# Patient Record
Sex: Female | Born: 1975 | Race: Black or African American | Hispanic: No | Marital: Single | Smoking: Former smoker
Health system: Southern US, Community
[De-identification: ages and names within clinical notes are randomized; demographics above are authoritative.]

## PROBLEM LIST (undated history)

## (undated) DIAGNOSIS — E0500 Thyrotoxicosis with diffuse goiter without thyrotoxic crisis or storm: Secondary | ICD-10-CM

## (undated) HISTORY — DX: Thyrotoxicosis with diffuse goiter without thyrotoxic crisis or storm: E05.00

## (undated) HISTORY — PX: BACK SURGERY: SHX140

---

## 2003-11-04 ENCOUNTER — Emergency Department: Payer: Self-pay | Admitting: Emergency Medicine

## 2005-10-03 ENCOUNTER — Observation Stay: Payer: Self-pay | Admitting: Obstetrics and Gynecology

## 2005-10-09 ENCOUNTER — Inpatient Hospital Stay: Payer: Self-pay

## 2006-09-01 ENCOUNTER — Inpatient Hospital Stay: Payer: Self-pay | Admitting: Obstetrics and Gynecology

## 2008-02-26 ENCOUNTER — Emergency Department: Payer: Self-pay | Admitting: Emergency Medicine
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
## 2008-04-04 ENCOUNTER — Emergency Department: Payer: Self-pay | Admitting: Internal Medicine

## 2008-07-16 ENCOUNTER — Emergency Department: Payer: Self-pay | Admitting: Emergency Medicine

## 2008-07-16 IMAGING — CR DG CHEST 2V
1 series · 2 of 2 positions shown · non-contrast
Comparison: none

REASON FOR EXAM: chest pain
COMMENTS:

PROCEDURE:     DXR - DXR CHEST PA (OR AP) AND LATERAL  - July 16, 2008  [DATE]
RESULT:     The lung fields are clear. The heart, mediastinal and osseous
structures show no acute changes.

[Series 1: view not recorded · 0.17mm/px · 2 of 2 slices shown]
[im 1/2]
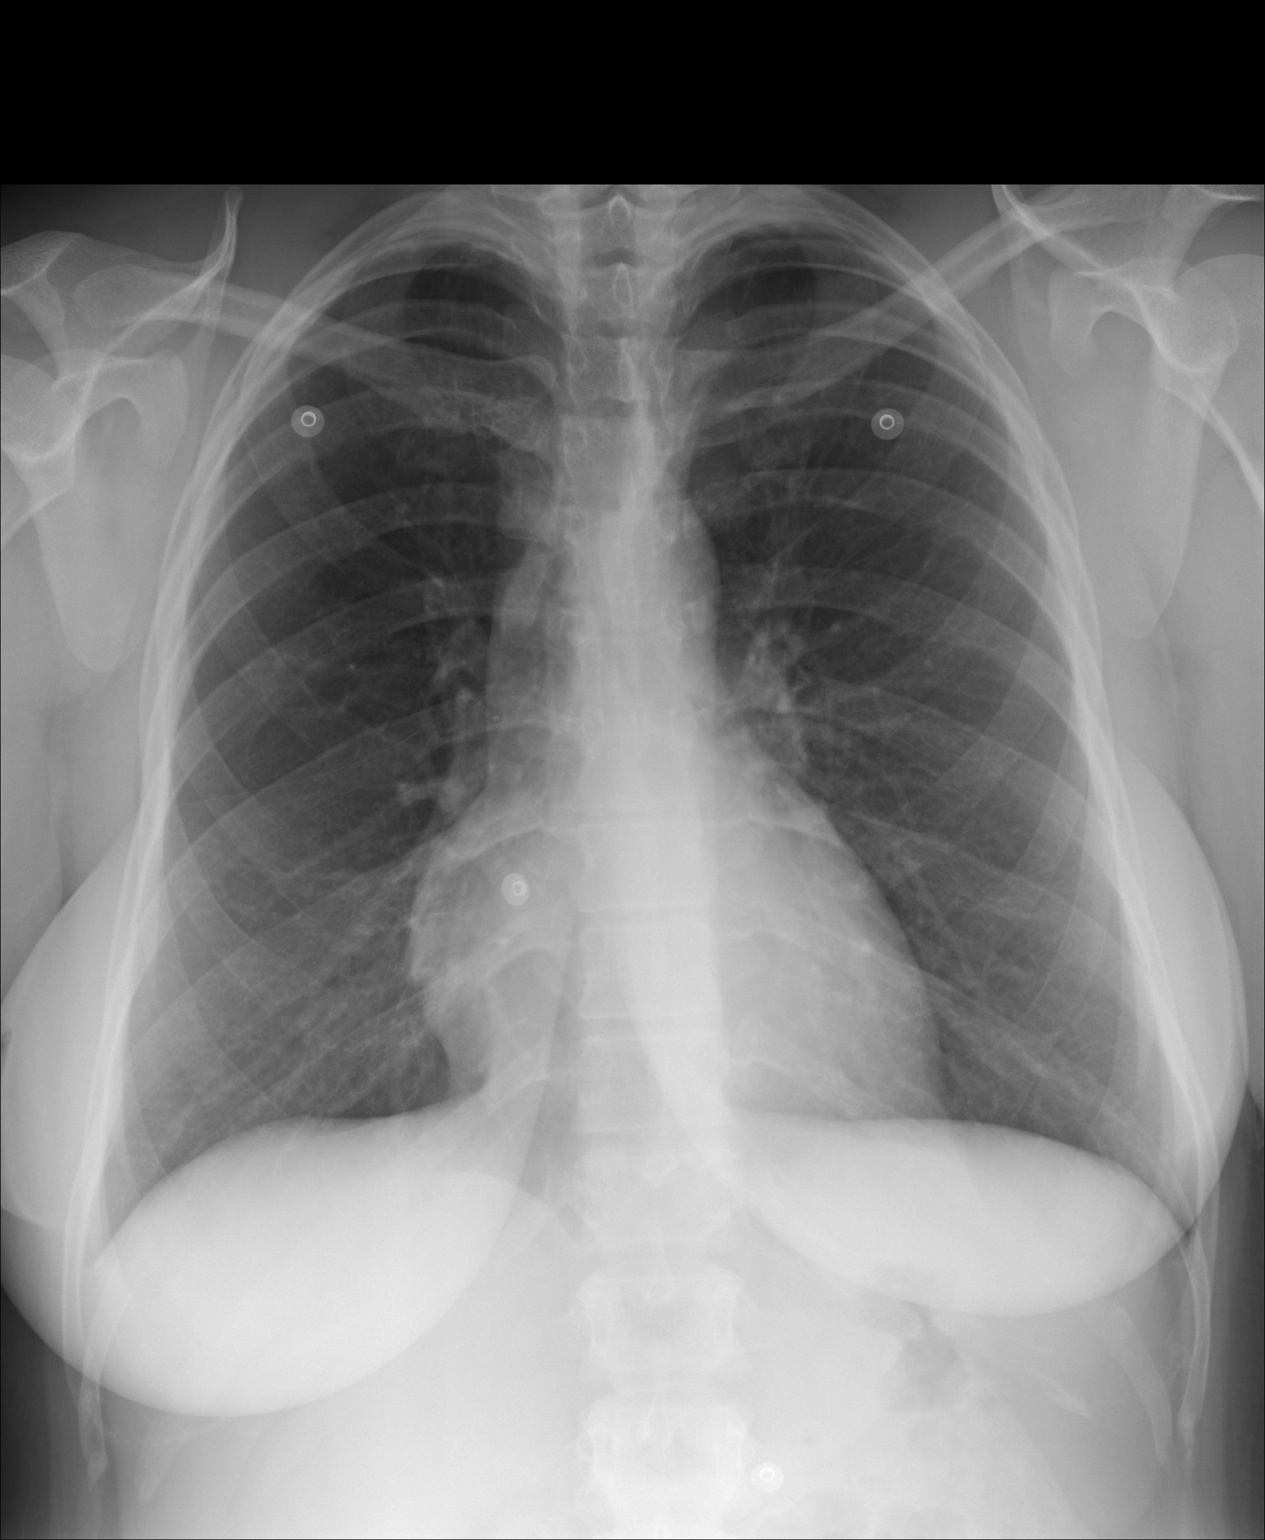
[im 2/2]
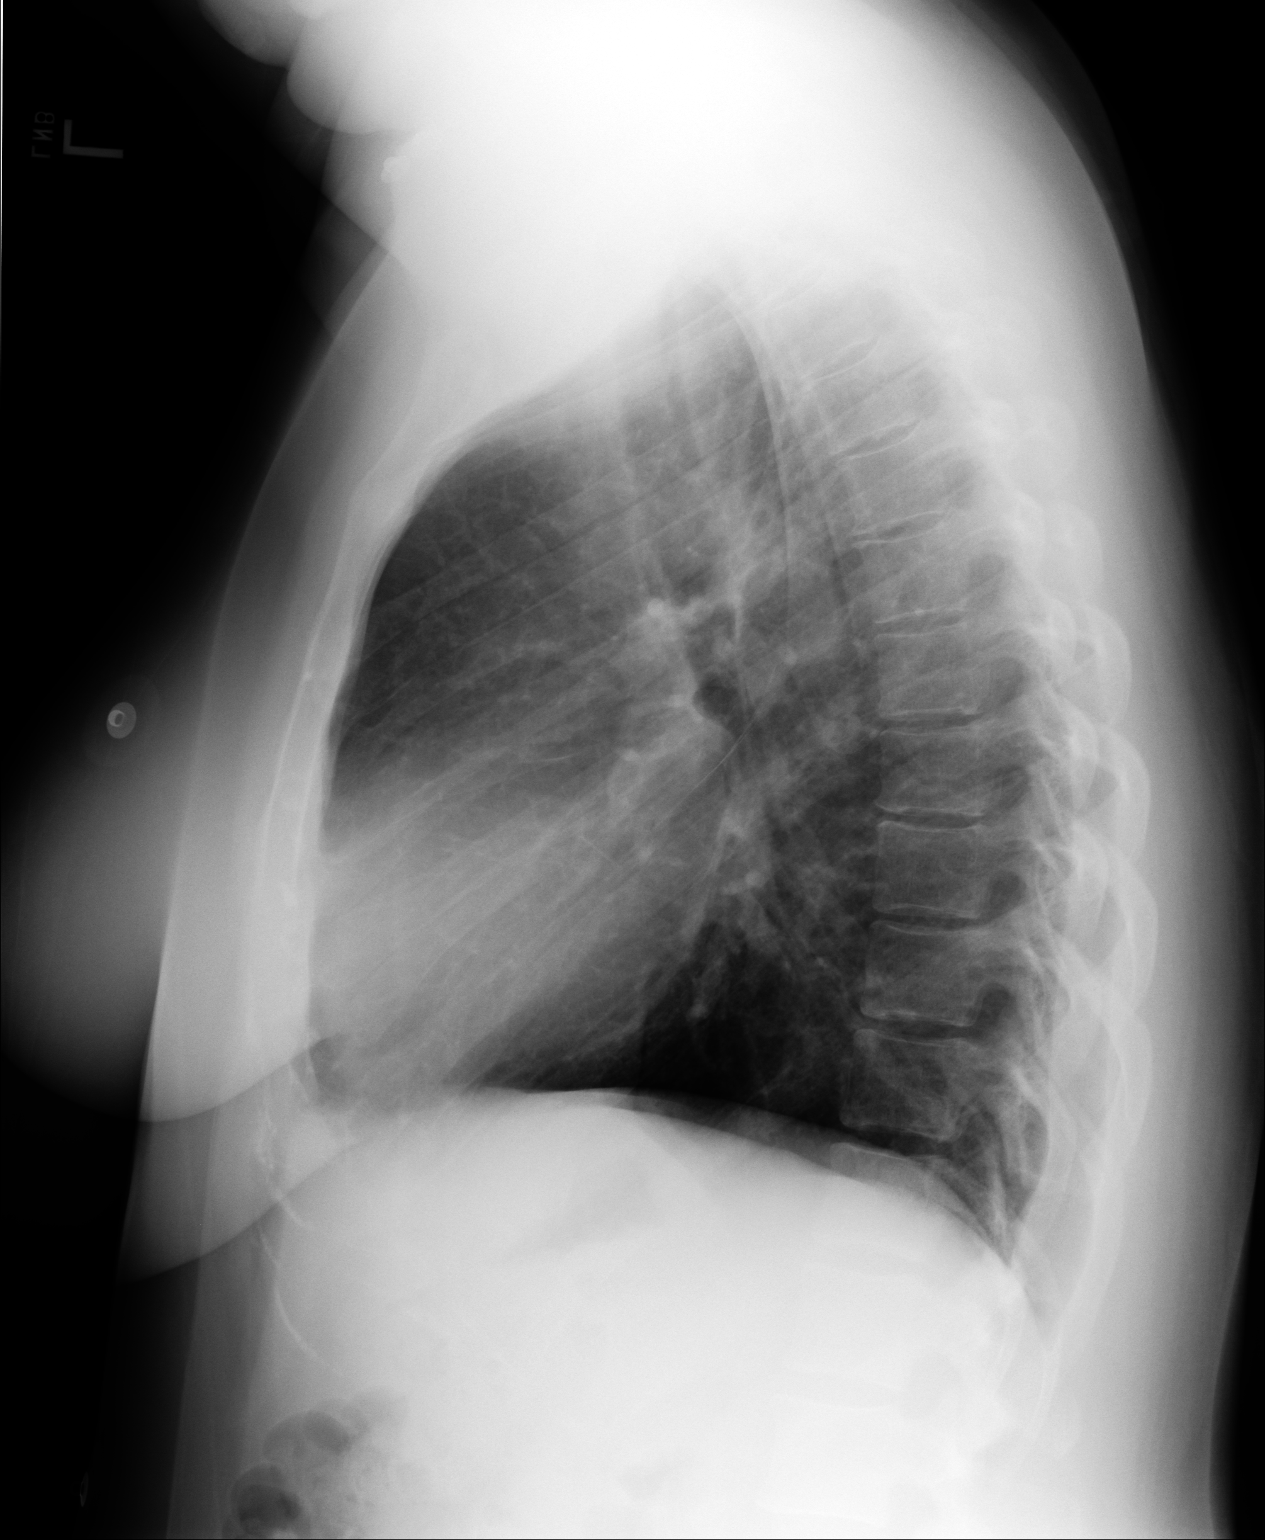

[2 of 2 positions shown; findings below may reference images not displayed]

IMPRESSION: 1.     No significant abnormalities are identified.

## 2009-04-18 ENCOUNTER — Emergency Department: Payer: Self-pay | Admitting: Emergency Medicine

## 2010-03-02 ENCOUNTER — Observation Stay: Payer: Self-pay | Admitting: Obstetrics & Gynecology

## 2010-04-20 ENCOUNTER — Inpatient Hospital Stay: Payer: Self-pay

## 2014-09-20 ENCOUNTER — Encounter: Payer: Self-pay | Admitting: *Deleted

## 2014-09-20 DIAGNOSIS — Z720 Tobacco use: Secondary | ICD-10-CM | POA: Insufficient documentation

## 2014-09-20 DIAGNOSIS — J020 Streptococcal pharyngitis: Secondary | ICD-10-CM | POA: Insufficient documentation

## 2014-09-20 NOTE — ED Notes (Signed)
Pt POSITIVE for strep A

## 2014-09-20 NOTE — ED Notes (Signed)
Pt has a sore throat and sinus congestion with a headache for several days.  cig smoker.  Pt alert.  No resp distress.

## 2014-09-21 ENCOUNTER — Encounter: Payer: Self-pay | Admitting: Emergency Medicine

## 2014-09-21 ENCOUNTER — Emergency Department
Admission: EM | Admit: 2014-09-21 | Discharge: 2014-09-21 | Disposition: A | Discharge status: Home / Self Care | Payer: PRIVATE HEALTH INSURANCE | Attending: Emergency Medicine | Admitting: Emergency Medicine

## 2014-09-21 DIAGNOSIS — J020 Streptococcal pharyngitis: Secondary | ICD-10-CM

## 2014-09-21 DIAGNOSIS — J069 Acute upper respiratory infection, unspecified: Secondary | ICD-10-CM

## 2014-09-21 MED ORDER — IBUPROFEN 800 MG PO TABS
800.0000 mg | ORAL_TABLET | Freq: Once | ORAL | Status: AC
Start: 2014-09-21 — End: 2014-09-21
  Administered 2014-09-21: 800 mg via ORAL
  Filled 2014-09-21: qty 1

## 2014-09-21 MED ORDER — AMOXICILLIN 500 MG PO TABS: 500.0000 mg | ORAL_TABLET | Freq: Two times a day (BID) | ORAL | Status: DC

## 2014-09-21 MED ORDER — AMOXICILLIN 500 MG PO CAPS
500.0000 mg | ORAL_CAPSULE | Freq: Once | ORAL | Status: AC
  Administered 2014-09-21: 500 mg via ORAL
  Filled 2014-09-21: qty 1

## 2014-09-21 NOTE — ED Provider Notes (Signed)
[facility] Emergency Department Provider Note  ____________________________________________  Time seen: 2 AM  I have reviewed the triage vital signs and the nursing notes.   HISTORY  Chief Complaint Facial Pain and Sore Throat     HPI Vickie Greene is a 39 y.o. female who reports she has had a sore throat for the past 3 days. She also has some nasal congestion and pressure.  She reports having a fever which she measured to "100 point something or so". She's been having chills and symptoms of a fever as well.  Due to the nasal congestion, she has been taking Tylenol Sinus. This is not given her much relief. She was unable to sleep tonight.  She also complains of a headache with these symptoms.    History reviewed. No pertinent past medical history.  There are no active problems to display for this patient.   History reviewed. No pertinent past surgical history.  Current Outpatient Rx  Name  Route  Sig  Dispense  Refill  . amoxicillin (AMOXIL) 500 MG tablet   Oral   Take 1 tablet (500 mg total) by mouth 2 (two) times daily.   14 tablet   0     Allergies Review of patient's allergies indicates no known allergies.  History reviewed. No pertinent family history.  Social History Social History  Substance Use Topics  . Smoking status: Current Every Day Smoker  . Smokeless tobacco: None  . Alcohol Use: No    Review of Systems  Constitutional: Positive for fever. ENT: Positive for sore throat and nasal pressure Cardiovascular: Negative for chest pain. Respiratory: Negative for shortness of breath. Gastrointestinal: Negative for abdominal pain, vomiting and diarrhea. Genitourinary: Negative for dysuria. Musculoskeletal: No myalgias or injuries. Skin: Negative for rash. Neurological: Negative for headaches   10-point ROS otherwise negative.  ____________________________________________   PHYSICAL EXAM:  VITAL SIGNS: ED  Triage Vitals  Enc Vitals Group     BP 09/20/14 2256 160/60 mmHg     Pulse Rate 09/20/14 2256 78     Resp 09/20/14 2256 20     Temp 09/20/14 2256 98.4 F (36.9 C)     Temp Source 09/20/14 2256 Oral     SpO2 09/20/14 2256 99 %     Weight 09/20/14 2256 205 lb (92.987 kg)     Height 09/20/14 2256 5\' 6"  (1.676 m)     Head Cir --      Peak Flow --      Pain Score 09/20/14 2258 9     Pain Loc --      Pain Edu? --      Excl. in GC? --     Constitutional:  Alert and oriented. Well appearing and in no distress. ENT   Head: Normocephalic and atraumatic.   Nose: Mild congestion noted.   Mouth/Throat: Mucous membranes are moist. Minimal erythema. No significant discharge. Cardiovascular: Normal rate, regular rhythm, no murmur noted Respiratory:  Normal respiratory effort, no tachypnea.    Breath sounds are clear and equal bilaterally.  Gastrointestinal: Soft and nontender. No distention.  Back: No muscle spasm, no tenderness, no CVA tenderness. Musculoskeletal: No deformity noted. Nontender with normal range of motion in all extremities.  No noted edema. Neurologic:  Normal speech and language. No gross focal neurologic deficits are appreciated.  Skin:  Skin is warm, dry. No rash noted. Psychiatric: Mood and affect are normal. Speech and behavior are normal.  ____________________________________________    LABS (pertinent positives/negatives)  Point of care strep test positive  ____________________________________________  ____________________________________________   INITIAL IMPRESSION / ASSESSMENT AND PLAN / ED COURSE  Pertinent labs & imaging results that were available during my care of the patient were reviewed by me and considered in my medical decision making (see chart for details).   Pleasant alert 39 year old female in no acute distress, but with some signs of a upper respiratory tract infection with congestion and with a sore throat. She has a positive  strep test. We'll treat her with amoxicillin. I have counseled her to take ibuprofen and Tylenol as needed.  ____________________________________________   FINAL CLINICAL IMPRESSION(S) / ED DIAGNOSES  Final diagnoses:  Strep throat  URI (upper respiratory infection)      Darien Ramus, MD 09/21/14 [phone]

## 2014-09-21 NOTE — Discharge Instructions (Signed)
You have a positive strep test-you have strep pharyngitis. You also have congestion through your nasal passages. This is unlikely to be a sinus infection or sinusitis. Take amoxicillin for your strep pharyngitis. Take Tylenol or ibuprofen as discussed if you have ongoing fever, headache, or aches. Return to the emergency department if you feel worse or have other urgent concerns.  Pharyngitis Pharyngitis is redness, pain, and swelling (inflammation) of your pharynx.  CAUSES  Pharyngitis is usually caused by infection. Most of the time, these infections are from viruses (viral) and are part of a cold. However, sometimes pharyngitis is caused by bacteria (bacterial). Pharyngitis can also be caused by allergies. Viral pharyngitis may be spread from person to person by coughing, sneezing, and personal items or utensils (cups, forks, spoons, toothbrushes). Bacterial pharyngitis may be spread from person to person by more intimate contact, such as kissing.  SIGNS AND SYMPTOMS  Symptoms of pharyngitis include:   Sore throat.   Tiredness (fatigue).   Low-grade fever.   Headache.  Joint pain and muscle aches.  Skin rashes.  Swollen lymph nodes.  Plaque-like film on throat or tonsils (often seen with bacterial pharyngitis). DIAGNOSIS  Your health care provider will ask you questions about your illness and your symptoms. Your medical history, along with a physical exam, is often all that is needed to diagnose pharyngitis. Sometimes, a rapid strep test is done. Other lab tests may also be done, depending on the suspected cause.  TREATMENT  Viral pharyngitis will usually get better in 3-4 days without the use of medicine. Bacterial pharyngitis is treated with medicines that kill germs (antibiotics).  HOME CARE INSTRUCTIONS   Drink enough water and fluids to keep your urine clear or pale yellow.   Only take over-the-counter or prescription medicines as directed by your health care provider:    If you are prescribed antibiotics, make sure you finish them even if you start to feel better.   Do not take aspirin.   Get lots of rest.   Gargle with 8 oz of salt water ( tsp of salt per 1 qt of water) as often as every 1-2 hours to soothe your throat.   Throat lozenges (if you are not at risk for choking) or sprays may be used to soothe your throat. SEEK MEDICAL CARE IF:   You have large, tender lumps in your neck.  You have a rash.  You cough up green, yellow-brown, or bloody spit. SEEK IMMEDIATE MEDICAL CARE IF:   Your neck becomes stiff.  You drool or are unable to swallow liquids.  You vomit or are unable to keep medicines or liquids down.  You have severe pain that does not go away with the use of recommended medicines.  You have trouble breathing (not caused by a stuffy nose). MAKE SURE YOU:   Understand these instructions.  Will watch your condition.  Will get help right away if you are not doing well or get worse. Document Released: 12/28/2004 Document Revised: 10/18/2012 Document Reviewed: 09/04/2012 [facility] Patient Information 2015 [place], [place]. This information is not intended to replace advice given to you by your health care provider. Make sure you discuss any questions you have with your health care provider.

## 2016-03-30 ENCOUNTER — Emergency Department
Admission: EM | Admit: 2016-03-30 | Discharge: 2016-03-30 | Disposition: A | Discharge status: Home / Self Care | Payer: PRIVATE HEALTH INSURANCE | Attending: Emergency Medicine | Admitting: Emergency Medicine

## 2016-03-30 DIAGNOSIS — J029 Acute pharyngitis, unspecified: Secondary | ICD-10-CM | POA: Insufficient documentation

## 2016-03-30 DIAGNOSIS — H6992 Unspecified Eustachian tube disorder, left ear: Secondary | ICD-10-CM | POA: Insufficient documentation

## 2016-03-30 DIAGNOSIS — Z792 Long term (current) use of antibiotics: Secondary | ICD-10-CM | POA: Insufficient documentation

## 2016-03-30 DIAGNOSIS — H6982 Other specified disorders of Eustachian tube, left ear: Secondary | ICD-10-CM

## 2016-03-30 DIAGNOSIS — H9202 Otalgia, left ear: Secondary | ICD-10-CM

## 2016-03-30 DIAGNOSIS — F17200 Nicotine dependence, unspecified, uncomplicated: Secondary | ICD-10-CM | POA: Insufficient documentation

## 2016-03-30 MED ORDER — AZITHROMYCIN 500 MG PO TABS
500.0000 mg | ORAL_TABLET | Freq: Once | ORAL | Status: AC
  Administered 2016-03-30: 500 mg via ORAL
  Filled 2016-03-30: qty 1

## 2016-03-30 MED ORDER — METHYLPREDNISOLONE 4 MG PO TBPK: ORAL_TABLET | ORAL | 0 refills | Status: DC

## 2016-03-30 MED ORDER — MAGIC MOUTHWASH
10.0000 mL | Freq: Once | ORAL | Status: AC
  Administered 2016-03-30: 10 mL via ORAL
  Filled 2016-03-30: qty 10

## 2016-03-30 MED ORDER — AZITHROMYCIN 250 MG PO TABS: 250.0000 mg | ORAL_TABLET | Freq: Every day | ORAL | 0 refills | Status: DC

## 2016-03-30 MED ORDER — MAGIC MOUTHWASH: 10.0000 mL | Freq: Three times a day (TID) | ORAL | 0 refills | Status: DC

## 2016-03-30 MED ORDER — LIDOCAINE HCL 4 % EX SOLN
Freq: Once | CUTANEOUS | Status: AC
  Administered 2016-03-30: 06:00:00 via TOPICAL
  Filled 2016-03-30: qty 50

## 2016-03-30 NOTE — Discharge Instructions (Signed)
1. Finish antibiotic as prescribed (azithromycin 250 mg daily 4 days.) Start your next dose Wednesday. 2. Take steroid taper as prescribed. 3. You may use Magic mouthwash as needed for throat discomfort. 4. Return to the ER for worsening symptoms, persistent vomiting, difficulty breathing or other concerns.

## 2016-03-30 NOTE — ED Provider Notes (Signed)
[facility] Emergency Department Provider Note   ____________________________________________   First MD Initiated Contact with Patient 03/30/16 [phone]     (approximate)  I have reviewed the triage vital signs and the nursing notes.   HISTORY  Chief Complaint Otalgia    HPI Vickie Greene is a 41 y.o. female who presents to the ED from home with a chief complaint of earache, sore throat 1 week. Patient reports left-sided earache, sensation of ear fullness. Denies discharge or barotrauma. Denies associated fever, chills, chest pain, shortness of breath, abdominal pain, nausea, vomiting, diarrhea. Denies recent travel or trauma. Nothing makes her symptoms better or worse.   Past medical history None  There are no active problems to display for this patient.   No past surgical history on file.  Prior to Admission medications   Medication Sig Start Date End Date Taking? Authorizing Provider  amoxicillin (AMOXIL) 500 MG tablet Take 1 tablet (500 mg total) by mouth 2 (two) times daily. 09/21/14   Darien Ramusavid W Kaminski, MD  azithromycin (ZITHROMAX) 250 MG tablet Take 1 tablet (250 mg total) by mouth daily. 03/30/16   Irean HongJade J Sung, MD  magic mouthwash SOLN Take 10 mLs by mouth 3 (three) times daily. 03/30/16   Irean HongJade J Sung, MD  methylPREDNISolone (MEDROL DOSEPAK) 4 MG TBPK tablet Take as directed 03/30/16   Irean HongJade J Sung, MD    Allergies Penicillins  No family history on file.  Social History Social History  Substance Use Topics  . Smoking status: Current Every Day Smoker  . Smokeless tobacco: Not on file  . Alcohol use No    Review of Systems  Constitutional: No fever/chills. Eyes: No visual changes. ENT: Positive for sore throat and left ear pain. Cardiovascular: Denies chest pain. Respiratory: Denies shortness of breath. Gastrointestinal: No abdominal pain.  No nausea, no vomiting.  No diarrhea.  No constipation. Genitourinary: Negative for  dysuria. Musculoskeletal: Negative for back pain. Skin: Negative for rash. Neurological: Negative for headaches, focal weakness or numbness.  10-point ROS otherwise negative.  ____________________________________________   PHYSICAL EXAM:  VITAL SIGNS: ED Triage Vitals  Enc Vitals Group     BP 03/30/16 0145 (!) 145/73     Pulse Rate 03/30/16 0143 78     Resp 03/30/16 0143 18     Temp 03/30/16 0143 98.7 F (37.1 C)     Temp Source 03/30/16 0143 Oral     SpO2 03/30/16 0143 96 %     Weight 03/30/16 0144 170 lb (77.1 kg)     Height 03/30/16 0144 5\' 6"  (1.676 m)     Head Circumference --      Peak Flow --      Pain Score 03/30/16 0144 7     Pain Loc --      Pain Edu? --      Excl. in GC? --     Constitutional: Alert and oriented. Well appearing and in no acute distress. Eyes: Conjunctivae are normal. PERRL. EOMI. Head: Atraumatic. Ears: Right TM within normal limits. Left TM nonerythematous but bulging without perforation. Nose: No congestion/rhinnorhea. Mouth/Throat: Mucous membranes are moist.  Oropharynx moderately erythematous without tonsillar exudates, swelling or peritonsillar abscess. There is no hoarse or muffled voice. There is no drooling. Neck: No stridor.  Supple neck without meningismus. Hematological/Lymphatic/Immunilogical: No cervical lymphadenopathy. Cardiovascular: Normal rate, regular rhythm. Grossly normal heart sounds.  Good peripheral circulation. Respiratory: Normal respiratory effort.  No retractions. Lungs CTAB. Gastrointestinal: Soft and nontender. No  distention. No abdominal bruits. No CVA tenderness. Musculoskeletal: No lower extremity tenderness nor edema.  No joint effusions. Neurologic:  Normal speech and language. No gross focal neurologic deficits are appreciated. No gait instability. Skin:  Skin is warm, dry and intact. No rash noted. Psychiatric: Mood and affect are normal. Speech and behavior are  normal.  ____________________________________________   LABS (all labs ordered are listed, but only abnormal results are displayed)  Labs Reviewed - No data to display ____________________________________________  EKG  None ____________________________________________  RADIOLOGY  None ____________________________________________   PROCEDURES  Procedure(s) performed: None  Procedures  Critical Care performed: No  ____________________________________________   INITIAL IMPRESSION / ASSESSMENT AND PLAN / ED COURSE  Pertinent labs & imaging results that were available during my care of the patient were reviewed by me and considered in my medical decision making (see chart for details).  41 year old female who presents with pharyngitis and left eustachian tube dysfunction. Will place on azithromycin, Medrol Dosepak and magic mouthwash; she will follow-up with her PCP next week. Strict return precautions given. Patient verbalizes understanding and agrees with plan of care.      ____________________________________________   FINAL CLINICAL IMPRESSION(S) / ED DIAGNOSES  Final diagnoses:  Otalgia of left ear  Pharyngitis, unspecified etiology  Eustachian tube dysfunction, left      NEW MEDICATIONS STARTED DURING THIS VISIT:  Discharge Medication List as of 03/30/2016  6:15 AM    START taking these medications   Details  azithromycin (ZITHROMAX) 250 MG tablet Take 1 tablet (250 mg total) by mouth daily., Starting Tue 03/30/2016, Print    magic mouthwash SOLN Take 10 mLs by mouth 3 (three) times daily., Starting Tue 03/30/2016, Print    methylPREDNISolone (MEDROL DOSEPAK) 4 MG TBPK tablet Take as directed, Print         Note:  This document was prepared using Dragon voice recognition software and may include unintentional dictation errors.    Irean Hong, MD 03/30/16 [phone]

## 2016-03-30 NOTE — ED Triage Notes (Signed)
Pt in with co left sided earache states now pain to left face.

## 2016-03-30 NOTE — ED Notes (Signed)

## 2016-04-06 ENCOUNTER — Encounter: Payer: Self-pay | Admitting: *Deleted

## 2016-04-06 ENCOUNTER — Emergency Department
Admission: EM | Admit: 2016-04-06 | Discharge: 2016-04-06 | Disposition: A | Discharge status: Home / Self Care | Payer: Self-pay | Attending: Emergency Medicine | Admitting: Emergency Medicine

## 2016-04-06 DIAGNOSIS — I889 Nonspecific lymphadenitis, unspecified: Secondary | ICD-10-CM | POA: Insufficient documentation

## 2016-04-06 DIAGNOSIS — H60312 Diffuse otitis externa, left ear: Secondary | ICD-10-CM | POA: Insufficient documentation

## 2016-04-06 DIAGNOSIS — H60319 Diffuse otitis externa, unspecified ear: Secondary | ICD-10-CM

## 2016-04-06 DIAGNOSIS — Z79899 Other long term (current) drug therapy: Secondary | ICD-10-CM | POA: Insufficient documentation

## 2016-04-06 DIAGNOSIS — F17200 Nicotine dependence, unspecified, uncomplicated: Secondary | ICD-10-CM | POA: Insufficient documentation

## 2016-04-06 MED ORDER — NEOMYCIN-POLYMYXIN-HC 3.5-10000-1 OT SOLN: 3.0000 [drp] | Freq: Three times a day (TID) | OTIC | 0 refills | Status: AC

## 2016-04-06 MED ORDER — AMOXICILLIN-POT CLAVULANATE 875-125 MG PO TABS: 1.0000 | ORAL_TABLET | Freq: Two times a day (BID) | ORAL | 0 refills | Status: AC

## 2016-04-06 NOTE — ED Triage Notes (Signed)
Pt has sore throat and left earache.  Pt was seen here last week with similar sx.  Pt alert.

## 2016-04-06 NOTE — Discharge Instructions (Signed)
Use the Augmentin 1 pill twice a day with food. Take the ear drops and use for 5 drops in the ear 4 times a day with a wick as we discussed. Please return if you're worse or no better in about a week. At that time we will probably refer you to the ear nose and throat doctor.

## 2016-04-06 NOTE — ED Provider Notes (Signed)
[facility] Emergency Department Provider Note   ____________________________________________   First MD Initiated Contact with Patient 04/06/16 [phone]     (approximate)  I have reviewed the triage vital signs and the nursing notes.   HISTORY  Chief Complaint Sore Throat   HPI Vickie Greene is a 41 y.o. female patient complains of sore throat and left earache and pain below the left ear for about a week. She took Zithromax but really didn't get any better all. Running a fever. Her ENT results of tender to traction at the left ear. Pain is moderate. Patient reports she has a history of penicillin allergy but has taken her children's Augmentin several times without any difficulty.   No past medical history on file.  There are no active problems to display for this patient.   No past surgical history on file.  Prior to Admission medications   Medication Sig Start Date End Date Taking? Authorizing Provider  amoxicillin (AMOXIL) 500 MG tablet Take 1 tablet (500 mg total) by mouth 2 (two) times daily. 09/21/14   Darien Ramusavid W Kaminski, MD  azithromycin (ZITHROMAX) 250 MG tablet Take 1 tablet (250 mg total) by mouth daily. 03/30/16   Irean HongJade J Sung, MD  magic mouthwash SOLN Take 10 mLs by mouth 3 (three) times daily. 03/30/16   Irean HongJade J Sung, MD  methylPREDNISolone (MEDROL DOSEPAK) 4 MG TBPK tablet Take as directed 03/30/16   Irean HongJade J Sung, MD    Allergies Penicillins  No family history on file.  Social History Social History  Substance Use Topics  . Smoking status: Current Every Day Smoker  . Smokeless tobacco: Never Used  . Alcohol use No    Review of Systems Constitutional: No fever/chills Eyes: No visual changes. ENT:  sore throat. Cardiovascular: Denies chest pain. Respiratory: Denies shortness of breath. Gastrointestinal: No abdominal pain.  No nausea, no vomiting.  No diarrhea.  No constipation. Genitourinary: Negative for dysuria. Musculoskeletal:  Negative for back pain. Skin: Negative for rash.  10-point ROS otherwise negative.  ____________________________________________   PHYSICAL EXAM:  VITAL SIGNS: ED Triage Vitals  Enc Vitals Group     BP 04/06/16 0113 132/70     Pulse Rate 04/06/16 0113 67     Resp 04/06/16 0113 18     Temp 04/06/16 0113 98.6 F (37 C)     Temp Source 04/06/16 0113 Oral     SpO2 04/06/16 0113 99 %     Weight 04/06/16 0111 170 lb (77.1 kg)     Height 04/06/16 0111 5\' 6"  (1.676 m)     Head Circumference --      Peak Flow --      Pain Score 04/06/16 0111 10     Pain Loc --      Pain Edu? --      Excl. in GC? --     Constitutional: Alert and oriented. Well appearing and in no acute distress. Eyes: Conjunctivae are normal. PERRL. EOMI. Head: Atraumatic. Nose: No congestion/rhinnorhea. Ears right TM is clear (TM is slightly pink. Left canal is slightly swollen in the left ear is tender to traction. Mouth/Throat: Mucous membranes are moist.  Oropharynx non-erythematous. Neck: No stridor.  Hematological/Lymphatic/Immunilogical: There is cervical lymphadenopathy -one 1 cm node below the left ear which is very tender. Cardiovascular: Normal rate, regular rhythm.   ____________________________________________   LABS (all labs ordered are listed, but only abnormal results are displayed)  Labs Reviewed - No data to display ____________________________________________  EKG   ____________________________________________  RADIOLOGY   ____________________________________________   PROCEDURES  Procedure(s) performed:   Procedures  Critical Care performed:  ____________________________________________   INITIAL IMPRESSION / ASSESSMENT AND PLAN / ED COURSE  Pertinent labs & imaging results that were available during my care of the patient were reviewed by me and considered in my medical decision making (see chart for details).         ____________________________________________   FINAL CLINICAL IMPRESSION(S) / ED DIAGNOSES  Final diagnoses:  Cervical adenitis  Chronic diffuse otitis externa, unspecified laterality   Is actually not chronic it's acute left external otitis   NEW MEDICATIONS STARTED DURING THIS VISIT:  New Prescriptions   No medications on file     Note:  This document was prepared using Dragon voice recognition software and may include unintentional dictation errors.    Arnaldo Natal, MD 04/06/16 [phone]

## 2017-05-02 ENCOUNTER — Other Ambulatory Visit: Payer: Self-pay

## 2017-05-02 ENCOUNTER — Emergency Department
Admission: EM | Admit: 2017-05-02 | Discharge: 2017-05-03 | Disposition: A | Discharge status: Home / Self Care | Payer: Self-pay | Attending: Emergency Medicine | Admitting: Emergency Medicine

## 2017-05-02 ENCOUNTER — Encounter: Payer: Self-pay | Admitting: Emergency Medicine

## 2017-05-02 DIAGNOSIS — D72829 Elevated white blood cell count, unspecified: Secondary | ICD-10-CM | POA: Insufficient documentation

## 2017-05-02 DIAGNOSIS — F17200 Nicotine dependence, unspecified, uncomplicated: Secondary | ICD-10-CM | POA: Insufficient documentation

## 2017-05-02 DIAGNOSIS — F322 Major depressive disorder, single episode, severe without psychotic features: Secondary | ICD-10-CM | POA: Insufficient documentation

## 2017-05-02 DIAGNOSIS — Z79899 Other long term (current) drug therapy: Secondary | ICD-10-CM | POA: Insufficient documentation

## 2017-05-02 LAB — COMPREHENSIVE METABOLIC PANEL
ALT: 10 U/L — ABNORMAL LOW (ref 14–54)
ANION GAP: 8 (ref 5–15)
AST: 20 U/L (ref 15–41)
Albumin: 4.1 g/dL (ref 3.5–5.0)
Alkaline Phosphatase: 84 U/L (ref 38–126)
BUN: 7 mg/dL (ref 6–20)
CHLORIDE: 107 mmol/L (ref 101–111)
CO2: 23 mmol/L (ref 22–32)
Calcium: 9.2 mg/dL (ref 8.9–10.3)
Creatinine, Ser: 0.64 mg/dL (ref 0.44–1.00)
GFR calc Af Amer: >60 mL/min (ref >60)
Glucose, Bld: 149 mg/dL — ABNORMAL HIGH (ref 65–99)
POTASSIUM: 3.9 mmol/L (ref 3.5–5.1)
Sodium: 138 mmol/L (ref 135–145)
Total Bilirubin: 0.4 mg/dL (ref 0.3–1.2)
Total Protein: 8.1 g/dL (ref 6.5–8.1)

## 2017-05-02 LAB — URINE DRUG SCREEN, QUALITATIVE (ARMC ONLY)
AMPHETAMINES, UR SCREEN: NOT DETECTED
Barbiturates, Ur Screen: NOT DETECTED
Benzodiazepine, Ur Scrn: NOT DETECTED
CANNABINOID 50 NG, UR ~~LOC~~: NOT DETECTED
Cocaine Metabolite,Ur ~~LOC~~: NOT DETECTED
MDMA (ECSTASY) UR SCREEN: NOT DETECTED
Methadone Scn, Ur: NOT DETECTED
Opiate, Ur Screen: NOT DETECTED
Phencyclidine (PCP) Ur S: NOT DETECTED
TRICYCLIC, UR SCREEN: NOT DETECTED

## 2017-05-02 LAB — CBC
HCT: 35.4 % (ref 35.0–47.0)
Hemoglobin: 11.8 g/dL — ABNORMAL LOW (ref 12.0–16.0)
MCH: 25.5 pg — AB (ref 26.0–34.0)
MCHC: 33.4 g/dL (ref 32.0–36.0)
MCV: 76.2 fL — ABNORMAL LOW (ref 80.0–100.0)
PLATELETS: 251 10*3/uL (ref 150–440)
RBC: 4.65 MIL/uL (ref 3.80–5.20)
RDW: 15.7 % — ABNORMAL HIGH (ref 11.5–14.5)
WBC: 20.3 10*3/uL — ABNORMAL HIGH (ref 3.6–11.0)

## 2017-05-02 LAB — ETHANOL

## 2017-05-02 LAB — URINALYSIS, COMPLETE (UACMP) WITH MICROSCOPIC
BILIRUBIN URINE: NEGATIVE
Glucose, UA: NEGATIVE mg/dL
Ketones, ur: NEGATIVE mg/dL
Leukocytes, UA: NEGATIVE
Nitrite: NEGATIVE
Protein, ur: NEGATIVE mg/dL
SPECIFIC GRAVITY, URINE: 1.003 — AB (ref 1.005–1.030)
pH: 6 (ref 5.0–8.0)

## 2017-05-02 LAB — SALICYLATE LEVEL

## 2017-05-02 LAB — ACETAMINOPHEN LEVEL

## 2017-05-02 LAB — POCT PREGNANCY, URINE: PREG TEST UR: NEGATIVE

## 2017-05-02 NOTE — ED Notes (Signed)
[facility]

## 2017-05-02 NOTE — ED Notes (Signed)
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
## 2017-05-02 NOTE — ED Notes (Signed)
IVC from [facility], [facility] called[facility]

## 2017-05-02 NOTE — ED Triage Notes (Addendum)
Patient ambulatory to triage with steady gait, without difficulty or distress noted, [place] [employer] [place]; pt sent from [facility] for SI; pt denies any SI or HI but reports recent job loss and "everyone overreacted"

## 2017-05-02 NOTE — ED Notes (Signed)
EDT, Beth to triage to change pt into behav scrubs

## 2017-05-02 NOTE — ED Notes (Signed)
BEHAVIORAL HEALTH ROUNDING  Patient sleeping: No.  Patient alert and oriented: yes  Behavior appropriate: Yes. ; If no, describe:  Nutrition and fluids offered: Yes  Toileting and hygiene offered: Yes  Sitter present: not applicable, Q 15 min safety rounds and observation.  Law enforcement present: Yes ODS  

## 2017-05-02 NOTE — ED Notes (Signed)
IVC from [facility] 

## 2018-01-25 ENCOUNTER — Emergency Department
Admission: EM | Admit: 2018-01-25 | Discharge: 2018-01-25 | Disposition: A | Discharge status: Home / Self Care | Payer: Self-pay | Attending: Emergency Medicine | Admitting: Emergency Medicine

## 2018-01-25 ENCOUNTER — Encounter: Payer: Self-pay | Admitting: Emergency Medicine

## 2018-01-25 ENCOUNTER — Other Ambulatory Visit: Payer: Self-pay

## 2018-01-25 DIAGNOSIS — H60501 Unspecified acute noninfective otitis externa, right ear: Secondary | ICD-10-CM | POA: Insufficient documentation

## 2018-01-25 DIAGNOSIS — F17210 Nicotine dependence, cigarettes, uncomplicated: Secondary | ICD-10-CM | POA: Insufficient documentation

## 2018-01-25 MED ORDER — CIPROFLOXACIN-DEXAMETHASONE 0.3-0.1 % OT SUSP
4.0000 [drp] | Freq: Once | OTIC | Status: AC
  Administered 2018-01-25: 4 [drp] via OTIC
  Filled 2018-01-25: qty 7.5
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
## 2018-01-25 NOTE — ED Triage Notes (Signed)
Sore throat and right ear pain x 2 days.   AAOx3.  Skin warm and dry. NAD

## 2018-09-18 ENCOUNTER — Encounter: Payer: Self-pay | Admitting: Emergency Medicine

## 2018-09-18 ENCOUNTER — Emergency Department
Admission: EM | Admit: 2018-09-18 | Discharge: 2018-09-18 | Disposition: A | Discharge status: Home / Self Care | Payer: Self-pay | Attending: Emergency Medicine | Admitting: Emergency Medicine

## 2018-09-18 ENCOUNTER — Other Ambulatory Visit: Payer: Self-pay

## 2018-09-18 DIAGNOSIS — J0110 Acute frontal sinusitis, unspecified: Secondary | ICD-10-CM | POA: Insufficient documentation

## 2018-09-18 DIAGNOSIS — R000 Tachycardia, unspecified: Secondary | ICD-10-CM | POA: Insufficient documentation

## 2018-09-18 DIAGNOSIS — F17200 Nicotine dependence, unspecified, uncomplicated: Secondary | ICD-10-CM | POA: Insufficient documentation

## 2018-09-18 MED ORDER — NAPROXEN 500 MG PO TABS: 500.0000 mg | ORAL_TABLET | Freq: Two times a day (BID) | ORAL | 0 refills | Status: AC

## 2018-09-18 MED ORDER — SULFAMETHOXAZOLE-TRIMETHOPRIM 800-160 MG PO TABS: 1.0000 | ORAL_TABLET | Freq: Two times a day (BID) | ORAL | 0 refills | Status: DC

## 2018-09-18 NOTE — ED Triage Notes (Signed)
Pt here for sinus infection sx.  Has had right face pain for 2 days with posterior drainage. Pt has been taking the blue liquid tylenol with decongestant.  Went to urgent care for sinus type sx and when they checked vitals they found HR high and did EKG and sent to ED. Repeated EKG and ST with PAC.  Pt denies heart racing at this time or chest pain or SHOB. Has smoker cough at baseline. No medical hx.  At times after taking the tylenol with decongestant felt like heart was beating faster but never had "chest pains".
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
## 2019-07-11 ENCOUNTER — Ambulatory Visit: Payer: Self-pay

## 2019-10-16 ENCOUNTER — Emergency Department: Payer: Self-pay

## 2019-10-16 ENCOUNTER — Other Ambulatory Visit: Payer: Self-pay

## 2019-10-16 ENCOUNTER — Emergency Department
Admission: EM | Admit: 2019-10-16 | Discharge: 2019-10-17 | Disposition: A | Discharge status: Home / Self Care | Payer: Self-pay | Attending: Emergency Medicine | Admitting: Emergency Medicine

## 2019-10-16 DIAGNOSIS — R0602 Shortness of breath: Secondary | ICD-10-CM | POA: Insufficient documentation

## 2019-10-16 DIAGNOSIS — L03211 Cellulitis of face: Secondary | ICD-10-CM

## 2019-10-16 DIAGNOSIS — Z87891 Personal history of nicotine dependence: Secondary | ICD-10-CM | POA: Insufficient documentation

## 2019-10-16 DIAGNOSIS — E0590 Thyrotoxicosis, unspecified without thyrotoxic crisis or storm: Secondary | ICD-10-CM

## 2019-10-16 DIAGNOSIS — R221 Localized swelling, mass and lump, neck: Secondary | ICD-10-CM | POA: Insufficient documentation

## 2019-10-16 LAB — COMPREHENSIVE METABOLIC PANEL
ALT: 17 U/L (ref 0–44)
AST: 17 U/L (ref 15–41)
Albumin: 3.8 g/dL (ref 3.5–5.0)
Alkaline Phosphatase: 122 U/L (ref 38–126)
Anion gap: 8 (ref 5–15)
BUN: 9 mg/dL (ref 6–20)
CO2: 23 mmol/L (ref 22–32)
Calcium: 9.2 mg/dL (ref 8.9–10.3)
Chloride: 108 mmol/L (ref 98–111)
Creatinine, Ser: 0.38 mg/dL — ABNORMAL LOW (ref 0.44–1.00)
GFR calc non Af Amer: >60 mL/min (ref >60)
Glucose, Bld: 123 mg/dL — ABNORMAL HIGH (ref 70–99)
Potassium: 3.5 mmol/L (ref 3.5–5.1)
Sodium: 139 mmol/L (ref 135–145)
Total Bilirubin: 0.4 mg/dL (ref 0.3–1.2)
Total Protein: 7.4 g/dL (ref 6.5–8.1)

## 2019-10-16 LAB — CBC
HCT: 32.1 % — ABNORMAL LOW (ref 36.0–46.0)
Hemoglobin: 11.5 g/dL — ABNORMAL LOW (ref 12.0–15.0)
MCH: 24.3 pg — ABNORMAL LOW (ref 26.0–34.0)
MCHC: 35.8 g/dL (ref 30.0–36.0)
MCV: 67.7 fL — ABNORMAL LOW (ref 80.0–100.0)
Platelets: 213 10*3/uL (ref 150–400)
RBC: 4.74 MIL/uL (ref 3.87–5.11)
RDW: 14 % (ref 11.5–15.5)
WBC: 10.1 10*3/uL (ref 4.0–10.5)
nRBC: 0 % (ref 0.0–0.2)

## 2019-10-16 LAB — TSH: TSH: <0.01 u[IU]/mL — ABNORMAL LOW (ref 0.350–4.500)

## 2019-10-16 IMAGING — CR DG NECK SOFT TISSUE
2 series · 2 of 2 positions shown · non-contrast
Comparison: None.

CLINICAL DATA: Neck swelling

EXAM:
NECK SOFT TISSUES - 1+ VIEW

[neck lat]
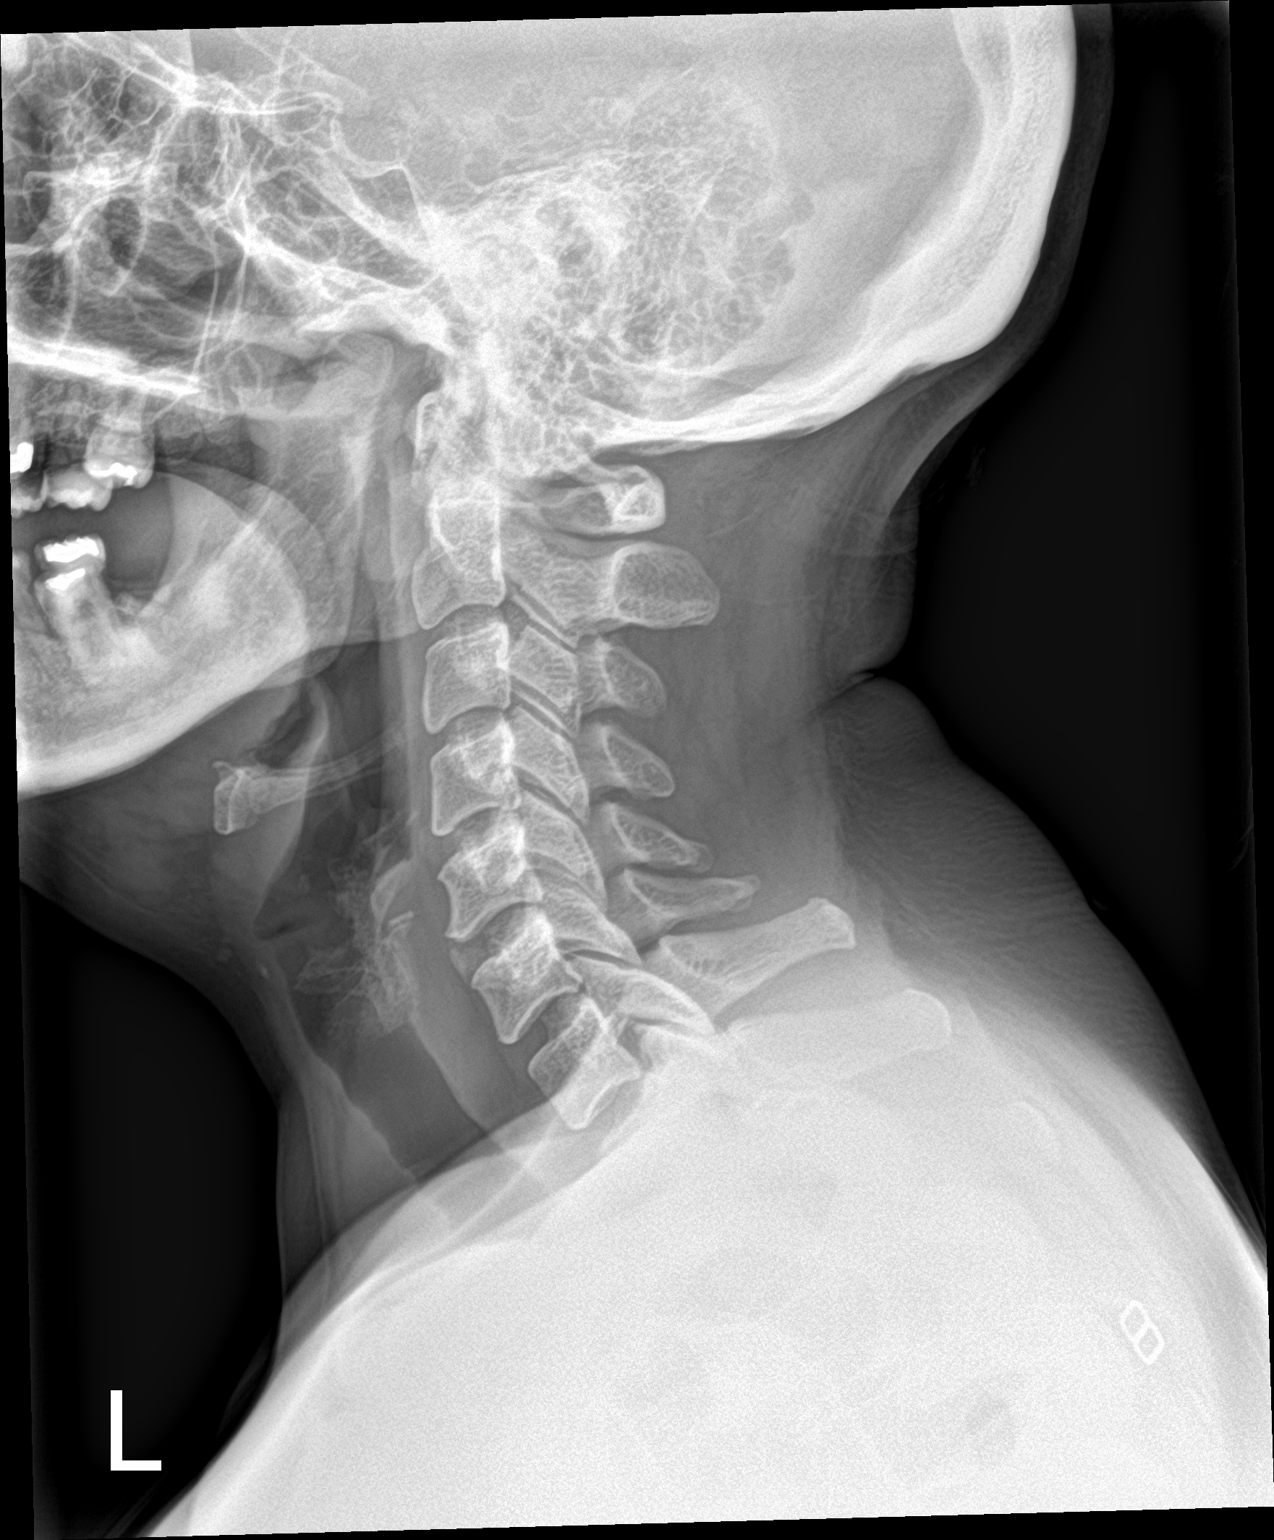

[neck ap]
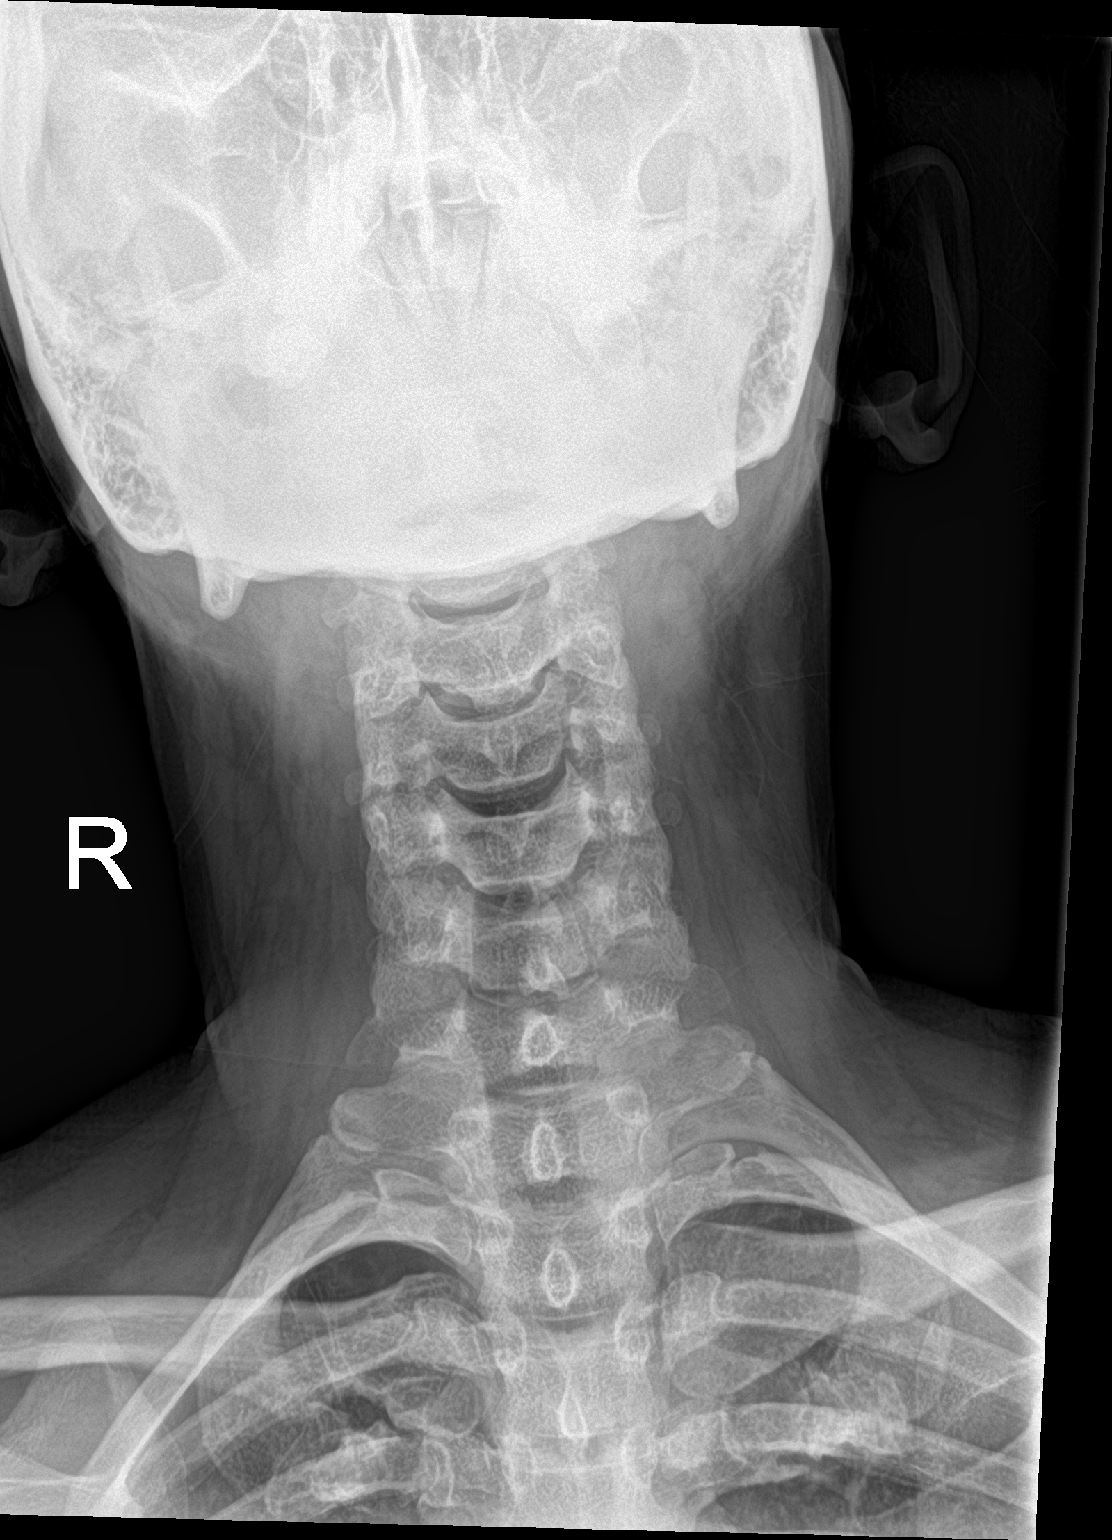

[2 of 2 positions shown; findings below may reference images not displayed]

FINDINGS: There is no evidence of retropharyngeal soft tissue swelling or
epiglottic enlargement. The cervical airway is unremarkable and no
radio-opaque foreign body identified.
IMPRESSION: Negative.

## 2019-10-16 NOTE — ED Triage Notes (Signed)
PT to ED via POV from home c/o neck swelling for a few days, gets worse at night. Swelling to bilateral throat area in the front. PT states last night she felt like she couldn't breath, currently pt is talking in full and complete sentences, able to manage secretions. Voice clear.
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
## 2019-10-17 ENCOUNTER — Emergency Department: Payer: Self-pay

## 2019-10-17 ENCOUNTER — Encounter: Payer: Self-pay | Admitting: Radiology

## 2019-10-17 LAB — T4, FREE: Free T4: 2.78 ng/dL — ABNORMAL HIGH (ref 0.61–1.12)

## 2019-10-17 IMAGING — CT CT NECK W/ CM
3 of 4 series · 13 of 33 positions shown, 16 images · IV contrast (omnipaque)
Comparison: Prior radiograph from 10/16/2019.

CLINICAL DATA: Initial evaluation for acute anterior neck swelling.

EXAM:
CT NECK WITH CONTRAST
TECHNIQUE: Multidetector CT imaging of the neck was performed using the
standard protocol following the bolus administration of intravenous
contrast.
CONTRAST:  75mL OMNIPAQUE IOHEXOL 300 MG/ML  SOLN

[Series 2: axial neck · axial · 0.52mm/px · z∈[+195,+353]mm · 5 of 119 slices shown, 7 images]
[im 20/119  soft-tissue]
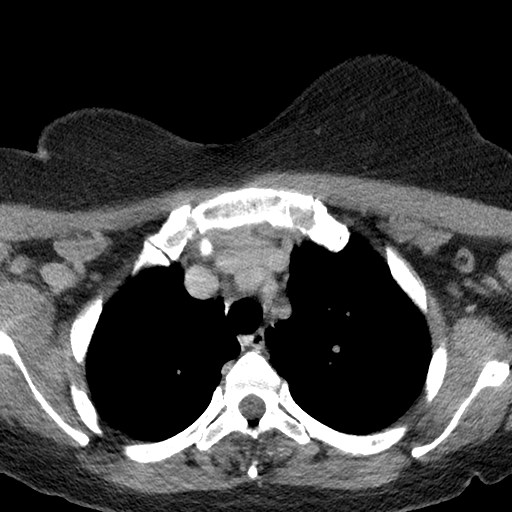
[im 20/119  bone]
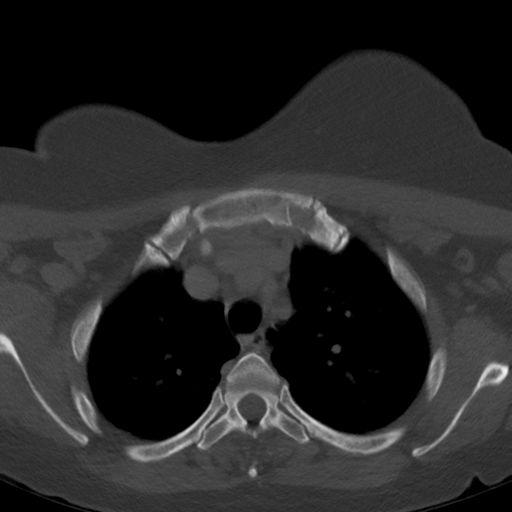
[im 40/119  bone]
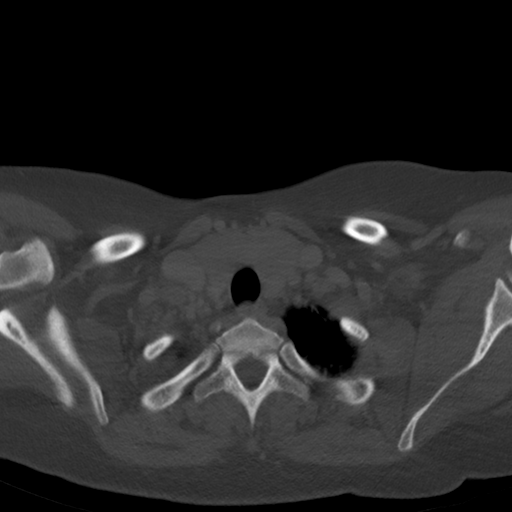
[im 60/119  bone]
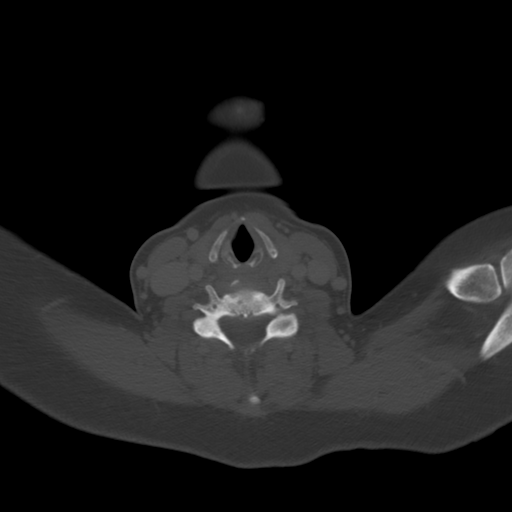
[im 79/119  bone]
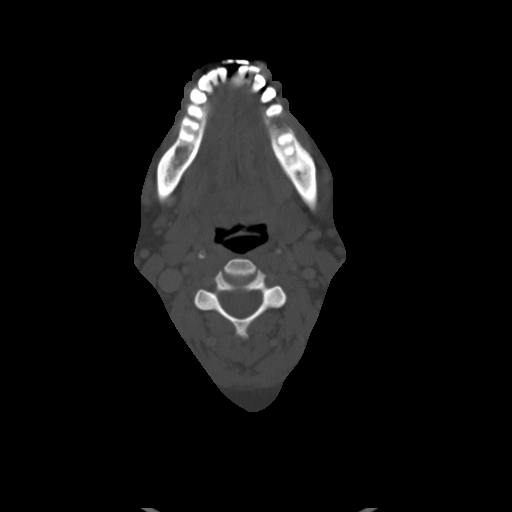
[im 99/119  soft-tissue]
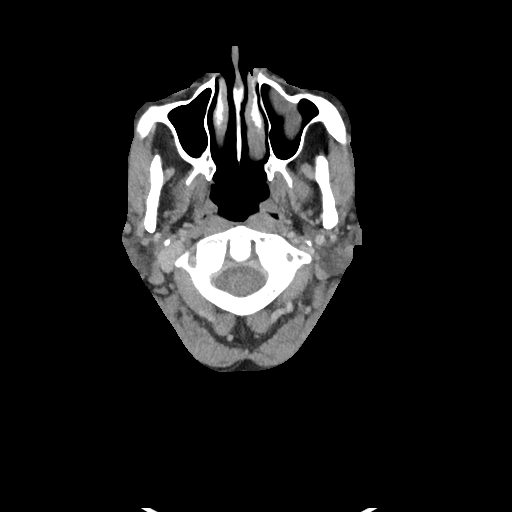
[im 99/119  bone]
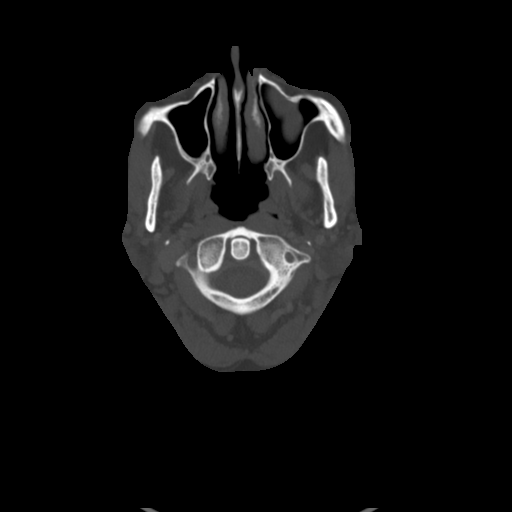

[Series 5: sag neck · sagittal · 0.42mm/px · 5 of 73 slices shown, 6 images]
[im 25/73  bone]
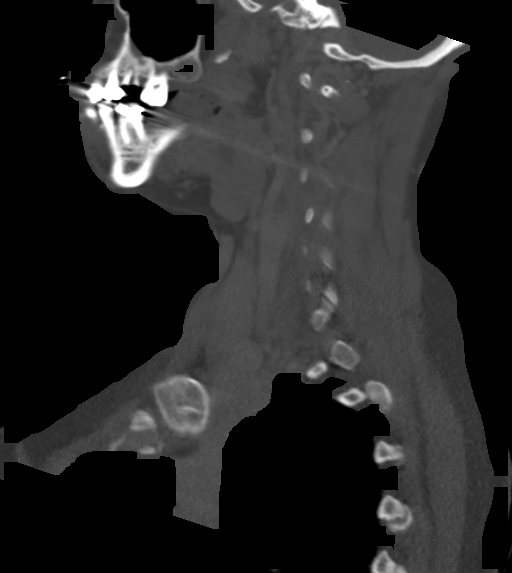
[im 31/73  bone]
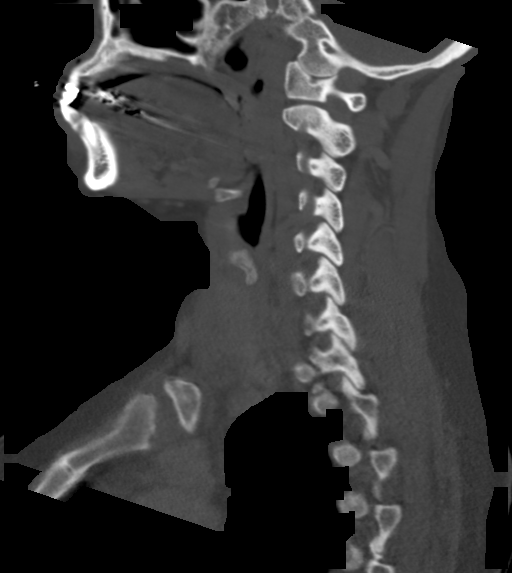
[im 37/73  soft-tissue]
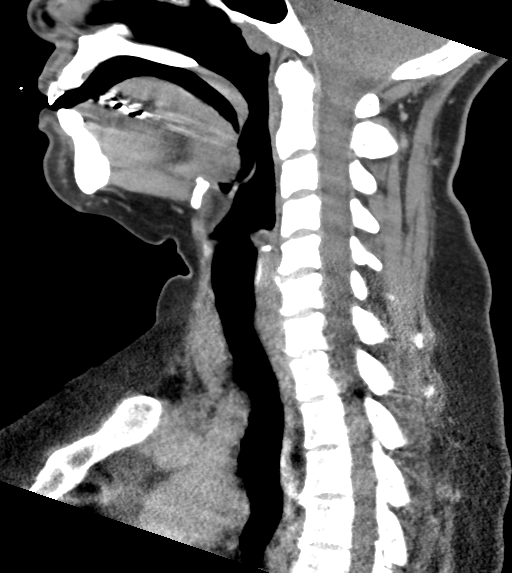
[im 37/73  bone]
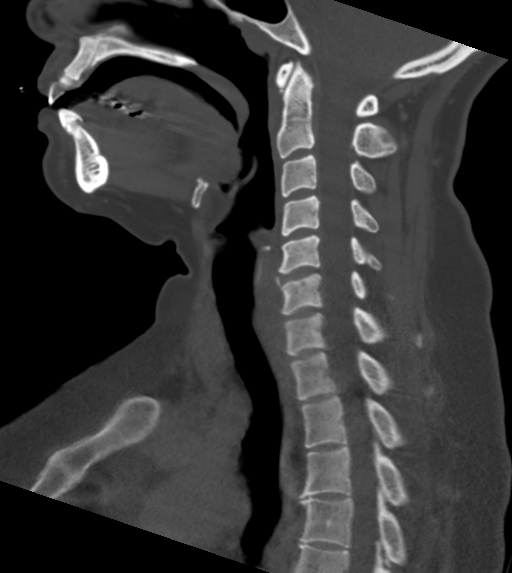
[im 43/73  bone]
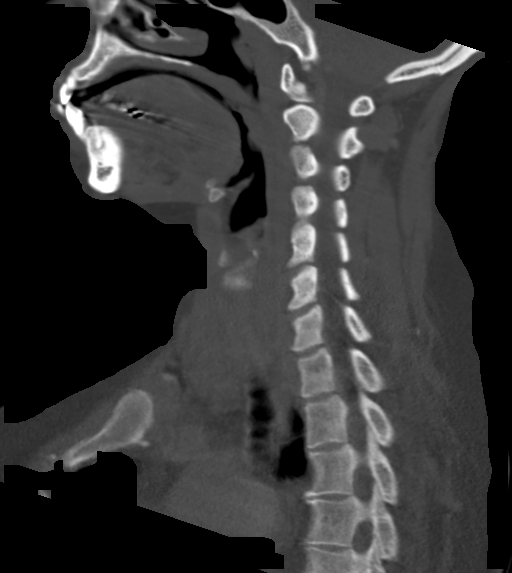
[im 49/73  bone]
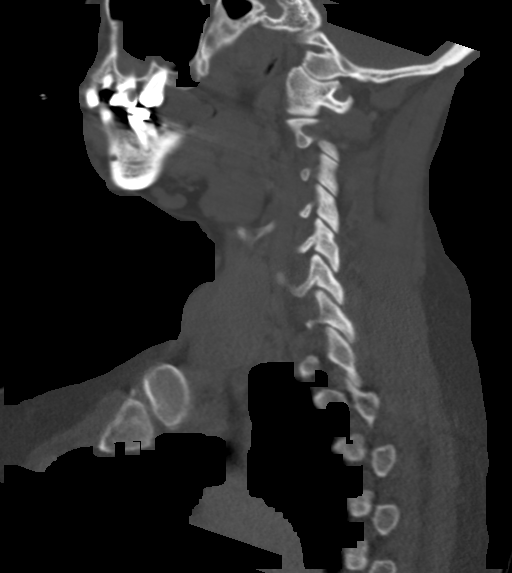

[Series 6: cor neck · coronal · 0.36mm/px · 3 of 89 slices shown]
[im 24/89  bone]
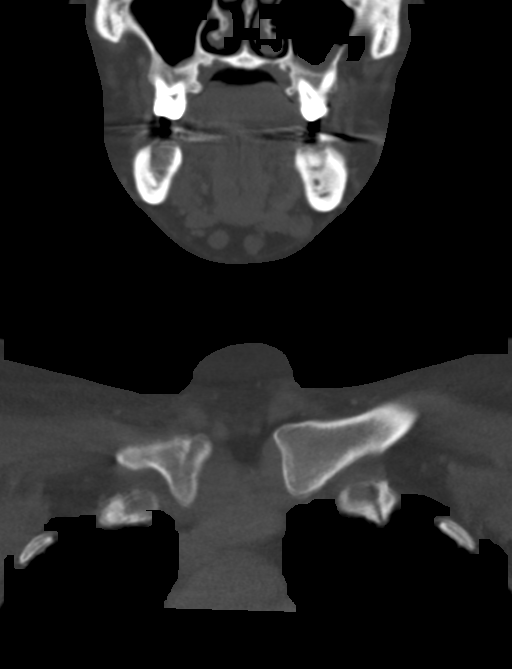
[im 38/89  bone]
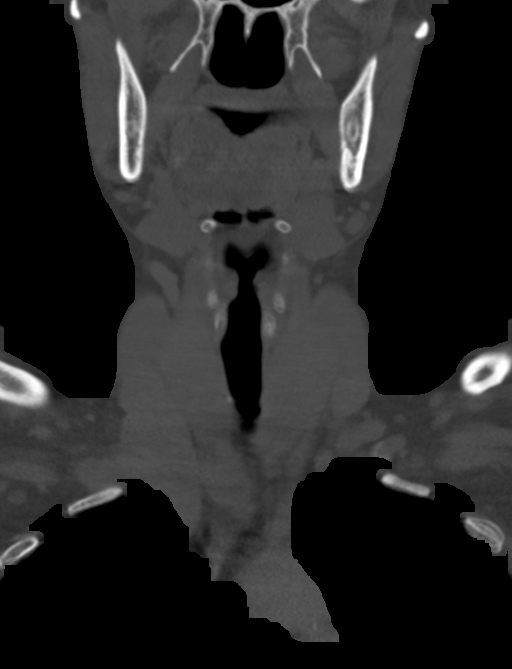
[im 51/89  bone]
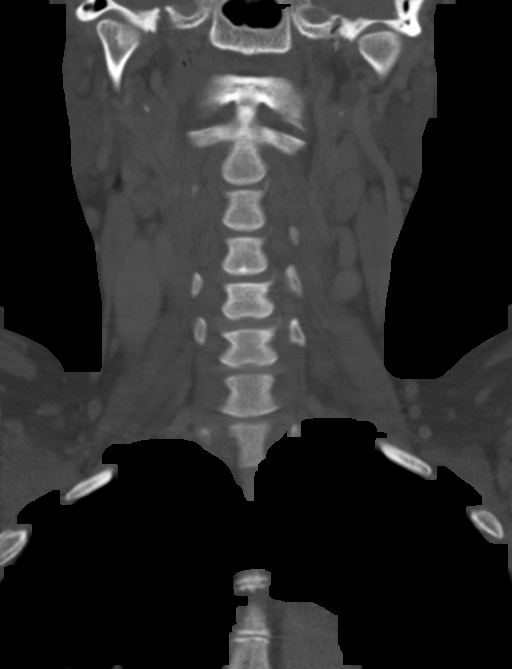

[13 of 33 positions shown; findings below may reference images not displayed]

FINDINGS: Pharynx and larynx: Oral cavity within normal limits without
discrete mass or collection. No base of tongue abnormality. Palatine
tonsils symmetric and within normal limits. Parapharyngeal fat
maintained. Remainder of the oropharynx and nasopharynx within
normal limits. No retropharyngeal swelling or collection. Epiglottis
normal. Vallecula clear. Remainder of the hypopharynx and
supraglottic larynx within normal limits. Glottis normal. Subglottic
airway widely patent clear.

Salivary glands: Salivary glands including the parotid and
submandibular glands are within normal limits. Few small nodular
densities within the parotid glands bilaterally felt to be most
consistent with small intraparotid lymph nodes.

Thyroid: Thyroid is somewhat diffusely enlarged without discrete
nodule or mass. No associated phlegm [REDACTED] changes to suggest acute
thyroiditis.

Lymph nodes: Mildly prominent submental nodes measure up to 7 mm in
short axis. There is a small 5 mm focus of soft tissue thickening
seen at the left chin/submental region (series 2, image 58). Mild
surrounding stranding within the adjacent subcutaneous fat. Finding
is nonspecific, but could reflect a small focal
infection/cellulitis. No discrete or drainable collection. Findings
are superficial, with no extension into the deeper spaces of the
neck. Mildly prominent level 1 B nodes measure up to 6 mm
bilaterally. Bilateral level II a nodes are mildly enlarged
measuring up to 12 mm. Additional scattered subcentimeter shotty
nodes noted within the neck. No other enlarged or pathologic
appearing adenopathy.

Vascular: Normal intravascular enhancement seen throughout the neck.

Limited intracranial: Unremarkable.

Visualized orbits: Visualized globes and orbital soft tissues within
normal limits.

Mastoids and visualized paranasal sinuses: Mucosal thickening and/or
retention cyst noted within the inferior maxillary sinuses
bilaterally, left greater than right. Visualized paranasal sinuses
are otherwise clear. Visualize mastoids and middle ear cavities are
well pneumatized and free of fluid.

Skeleton: No acute osseous finding. No discrete or worrisome osseous
lesions. Poor dentition with innumerable dental caries seen about
the teeth.

Upper chest: Paraseptal and centrilobular emphysematous changes
noted within the visualized lungs. Visualized upper chest
demonstrates no other acute abnormality.

Other: None.
IMPRESSION: 1. Small 5 mm focus of soft tissue thickening at the left
chin/submental region with mild surrounding stranding, nonspecific,
but could reflect a small focus of infection/cellulitis. No discrete
abscess or drainable collection.
2. Mildly prominent bilateral upper cervical adenopathy as above,
nonspecific, but may be reactive.
3. Poor dentition with innumerable dental caries about the teeth.
4. Emphysema ([id number]).

## 2019-10-17 MED ORDER — PROPRANOLOL HCL ER 80 MG PO CP24
80.0000 mg | ORAL_CAPSULE | Freq: Once | ORAL | Status: AC
  Administered 2019-10-17: 80 mg via ORAL
  Filled 2019-10-17: qty 1

## 2019-10-17 MED ORDER — PROPRANOLOL HCL ER BEADS 80 MG PO CP24: 80.0000 mg | ORAL_CAPSULE | Freq: Every day | ORAL | 1 refills | Status: AC

## 2019-10-17 MED ORDER — CLINDAMYCIN HCL 300 MG PO CAPS: 300.0000 mg | ORAL_CAPSULE | Freq: Three times a day (TID) | ORAL | 0 refills | Status: DC

## 2019-10-17 MED ORDER — METRONIDAZOLE 500 MG PO TABS
500.0000 mg | ORAL_TABLET | Freq: Once | ORAL | Status: AC
  Administered 2019-10-17: 500 mg via ORAL
  Filled 2019-10-17: qty 1

## 2019-10-17 MED ORDER — SODIUM CHLORIDE 0.9 % IV SOLN
1.0000 g | Freq: Once | INTRAVENOUS | Status: AC
  Administered 2019-10-17: 1 g via INTRAVENOUS
  Filled 2019-10-17: qty 10

## 2019-10-17 MED ORDER — CLINDAMYCIN PHOSPHATE 600 MG/50ML IV SOLN
600.0000 mg | Freq: Once | INTRAVENOUS | Status: DC
  Administered 2019-10-17: 02:00:00 600 mg via INTRAVENOUS
  Filled 2019-10-17: qty 50

## 2019-10-17 MED ORDER — IOHEXOL 300 MG/ML  SOLN
75.0000 mL | Freq: Once | INTRAMUSCULAR | Status: AC | PRN
  Administered 2019-10-17: 75 mL via INTRAVENOUS

## 2019-10-17 MED ORDER — METRONIDAZOLE 500 MG PO TABS: 500.0000 mg | ORAL_TABLET | Freq: Three times a day (TID) | ORAL | 0 refills | Status: AC

## 2019-10-17 MED ORDER — PROPRANOLOL HCL ER 80 MG PO CP24: 160.0000 mg | ORAL_CAPSULE | Freq: Once | ORAL | Status: DC

## 2019-10-17 MED ORDER — CEPHALEXIN 500 MG PO CAPS: 500.0000 mg | ORAL_CAPSULE | Freq: Three times a day (TID) | ORAL | 0 refills | Status: AC

## 2019-10-17 NOTE — ED Notes (Signed)
Pt actively vomiting while lying on stretcher in exam room. IV antibiotics infusing without difficulty. antibiotics stopped and MD notified.

## 2019-10-17 NOTE — ED Provider Notes (Addendum)
Accepted signout of this patient from Dr. Derrill Kay at 11 PM.  Patient is pending a CT of the neck and thyroid studies.  She presented with swelling of her neck.  I examined the patient and the swelling is actually in the submental region with mild induration erythema.  There is no signs of Ludewig's angina with no trismus, floor of the mouth is soft.  Review of the CT shows mild cellulitis with no air, no abscess, normal thyroid.  No signs of sepsis.  TSH is undetectable and free T4 is elevated at 2.78.  There are no clinical signs of thyroid storm at this time with no fever, no tachycardia and no other symptoms.  Since patient does not have a primary care doctor I will start her on propranolol.  I did discuss this finding with patient and recommended urgent follow-up for further management.  Patient was provided with documentation for charity care.  For her facial cellulitis she was given a dose of IV clindamycin and she will be discharged on a 10-day course.  Patient does have a dentist and recommended close follow-up with her dentist since the infection is most likely coming from one of her teeth.  I discussed my strict return precautions for any signs of worsening infection or Ludewig's angina.   _________________________ 2:10 AM on 10/17/2019 -----------------------------------------  As soon as IV Clinda was initiated patient started vomiting.  Therefore will stop the infusion.  She is a has allergy to penicillin.  Therefore patient was given Rocephin and Flagyl and will be discharged home on Keflex and Flagyl.   I have personally reviewed the images performed during this visit and I agree with the Radiologist's read.   Interpretation by Radiologist:  DG Neck Soft Tissue  Result Date: 10/16/2019 CLINICAL DATA:  Neck swelling EXAM: NECK SOFT TISSUES - 1+ VIEW COMPARISON:  None. FINDINGS: There is no evidence of retropharyngeal soft tissue swelling or epiglottic enlargement. The cervical airway  is unremarkable and no radio-opaque foreign body identified. IMPRESSION: Negative. Electronically Signed   By: Jasmine Pang M.D.   On: 10/16/2019 21:24   CT Soft Tissue Neck W Contrast  Result Date: 10/17/2019 CLINICAL DATA:  Initial evaluation for acute anterior neck swelling. EXAM: CT NECK WITH CONTRAST TECHNIQUE: Multidetector CT imaging of the neck was performed using the standard protocol following the bolus administration of intravenous contrast. CONTRAST:  38mL OMNIPAQUE IOHEXOL 300 MG/ML  SOLN COMPARISON:  Prior radiograph from 10/16/2019. FINDINGS: Pharynx and larynx: Oral cavity within normal limits without discrete mass or collection. No base of tongue abnormality. Palatine tonsils symmetric and within normal limits. Parapharyngeal fat maintained. Remainder of the oropharynx and nasopharynx within normal limits. No retropharyngeal swelling or collection. Epiglottis normal. Vallecula clear. Remainder of the hypopharynx and supraglottic larynx within normal limits. Glottis normal. Subglottic airway widely patent clear. Salivary glands: Salivary glands including the parotid and submandibular glands are within normal limits. Few small nodular densities within the parotid glands bilaterally felt to be most consistent with small intraparotid lymph nodes. Thyroid: Thyroid is somewhat diffusely enlarged without discrete nodule or mass. No associated phlegm a tori changes to suggest acute thyroiditis. Lymph nodes: Mildly prominent submental nodes measure up to 7 mm in short axis. There is a small 5 mm focus of soft tissue thickening seen at the left chin/submental region (series 2, image 58). Mild surrounding stranding within the adjacent subcutaneous fat. Finding is nonspecific, but could reflect a small focal infection/cellulitis. No discrete or drainable collection. Findings are  superficial, with no extension into the deeper spaces of the neck. Mildly prominent level 1 B nodes measure up to 6 mm  bilaterally. Bilateral level II a nodes are mildly enlarged measuring up to 12 mm. Additional scattered subcentimeter shotty nodes noted within the neck. No other enlarged or pathologic appearing adenopathy. Vascular: Normal intravascular enhancement seen throughout the neck. Limited intracranial: Unremarkable. Visualized orbits: Visualized globes and orbital soft tissues within normal limits. Mastoids and visualized paranasal sinuses: Mucosal thickening and/or retention cyst noted within the inferior maxillary sinuses bilaterally, left greater than right. Visualized paranasal sinuses are otherwise clear. Visualize mastoids and middle ear cavities are well pneumatized and free of fluid. Skeleton: No acute osseous finding. No discrete or worrisome osseous lesions. Poor dentition with innumerable dental caries seen about the teeth. Upper chest: Paraseptal and centrilobular emphysematous changes noted within the visualized lungs. Visualized upper chest demonstrates no other acute abnormality. Other: None. IMPRESSION: 1. Small 5 mm focus of soft tissue thickening at the left chin/submental region with mild surrounding stranding, nonspecific, but could reflect a small focus of infection/cellulitis. No discrete abscess or drainable collection. 2. Mildly prominent bilateral upper cervical adenopathy as above, nonspecific, but may be reactive. 3. Poor dentition with innumerable dental caries about the teeth. 4. Emphysema (ICD10-J43.9). Electronically Signed   By: Rise Mu M.D.   On: 10/17/2019 00:52      Nita Sickle, MD 10/17/19 0150    Nita Sickle, MD 10/17/19 [employer]

## 2019-10-17 NOTE — ED Notes (Signed)
pt resting on stretcher with antibiotics infusing. No longer vomiting and denies nausea. No distress noted at this time.

## 2019-10-17 NOTE — Discharge Instructions (Addendum)
Take Flagyl and Keflex 3 times a day for 10 days for ear infection.  Follow-up with your dentist as soon as possible.  If the swelling under your chin is getting worse, if you have difficulty opening your mouth, difficulty swallowing, or fever please return to the emergency room immediately as this could become a very bad, life-threatening infection.  As we discussed, your thyroid hormone is also very elevated.  You will need to see a primary care doctor for further management of this.  If you do not follow-up for further care you may develop a thyroid storm which can make you very sick and can also be life-threatening.  In the meantime I am prescribing you propranolol which you should take once a day at bedtime.  Return to the emergency room for fever, chest pain, tachycardia.

## 2020-08-18 ENCOUNTER — Other Ambulatory Visit: Payer: Self-pay

## 2020-08-18 DIAGNOSIS — Z5321 Procedure and treatment not carried out due to patient leaving prior to being seen by health care provider: Secondary | ICD-10-CM | POA: Insufficient documentation

## 2020-08-18 DIAGNOSIS — K047 Periapical abscess without sinus: Secondary | ICD-10-CM | POA: Insufficient documentation

## 2020-08-18 MED ORDER — ONDANSETRON 4 MG PO TBDP
4.0000 mg | ORAL_TABLET | Freq: Once | ORAL | Status: AC
  Administered 2020-08-18: 4 mg via ORAL
  Filled 2020-08-18: qty 1

## 2020-08-18 MED ORDER — OXYCODONE-ACETAMINOPHEN 5-325 MG PO TABS
1.0000 | ORAL_TABLET | Freq: Once | ORAL | Status: AC
  Administered 2020-08-18: 1 via ORAL
  Filled 2020-08-18: qty 1

## 2020-08-18 NOTE — ED Triage Notes (Addendum)
Pt states she woke up yesterday morning and she had some swelling to her right lower jaw, pt states today is much worse. Pt states she does has infected tooth on the lower right side. Pt denies any drainage from area. Pt denies difficulty swallowing or breathing,

## 2020-08-19 ENCOUNTER — Emergency Department
Admission: EM | Admit: 2020-08-19 | Discharge: 2020-08-19 | Discharge status: Against Medical Advice | Payer: Self-pay | Attending: Emergency Medicine | Admitting: Emergency Medicine

## 2020-12-09 ENCOUNTER — Ambulatory Visit (LOCAL_COMMUNITY_HEALTH_CENTER): Payer: Self-pay | Admitting: Family Medicine

## 2020-12-09 ENCOUNTER — Other Ambulatory Visit: Payer: Self-pay

## 2020-12-09 ENCOUNTER — Encounter: Payer: Self-pay | Admitting: Family Medicine

## 2020-12-09 VITALS — BP 123/75 | Ht 66.0 in | Wt 251.6 lb

## 2020-12-09 DIAGNOSIS — Z0000 Encounter for general adult medical examination without abnormal findings: Secondary | ICD-10-CM

## 2020-12-09 DIAGNOSIS — Z3009 Encounter for other general counseling and advice on contraception: Secondary | ICD-10-CM

## 2020-12-09 DIAGNOSIS — Z113 Encounter for screening for infections with a predominantly sexual mode of transmission: Secondary | ICD-10-CM

## 2020-12-09 LAB — WET PREP FOR TRICH, YEAST, CLUE
Trichomonas Exam: NEGATIVE
Yeast Exam: NEGATIVE

## 2020-12-09 NOTE — Progress Notes (Signed)
Pt here for PE and STD check.  Wet mount results reviewed, no treatment required.  Pt declined condoms. Berdie Ogren, RN

## 2020-12-09 NOTE — Progress Notes (Addendum)
[facility] DEPARTMENT [facility] [address] Main Number: [phone]    Family Planning Visit- Initial Visit  Subjective:  Vickie Greene is a 45 y.o.  [phone]   being seen today for an initial annual visit and to discuss contraceptive options.  The patient is currently using None for pregnancy prevention. Patient reports she does not want a pregnancy in the next year.  Patient has the following medical conditions does not have a problem list on file.  Chief Complaint  Patient presents with   Annual Exam    PE and STD check    Patient reports here for physical and STI check.    Patient denies any problems or concerns.     Body mass index is 40.61 kg/m. - Patient is eligible for diabetes screening based on BMI and age >9?  yes HA1C ordered? Pt declines.   Patient reports 1   partner/s in last year. Desires STI screening?  Yes  Has patient been screened once for HCV in the past?  No  No results found for: HCVAB  Does the patient have current drug use (including MJ), have a partner with drug use, and/or has been incarcerated since last result? No  If yes-- Screen for HCV through [facility]   Does the patient meet criteria for HBV testing? No  Criteria:  -Household, sexual or needle sharing contact with HBV -History of drug use -HIV positive -Those with known Hep C   Health Maintenance Due  Topic Date Due   COVID-19 Vaccine (1) Never done   HIV Screening  Never done   Hepatitis C Screening  Never done   TETANUS/TDAP  Never done   INFLUENZA VACCINE  Never done    Review of Systems  Constitutional:  Negative for chills, fever, malaise/fatigue and weight loss.  HENT:  Negative for congestion, hearing loss and sore throat.   Eyes:  Negative for blurred vision, double vision and photophobia.  Respiratory:  Negative for shortness of breath.   Cardiovascular:  Negative for chest pain.  Gastrointestinal:  Negative for  abdominal pain, blood in stool, constipation, diarrhea, heartburn, nausea and vomiting.  Genitourinary:  Negative for dysuria and frequency.  Musculoskeletal:  Negative for back pain, joint pain and neck pain.  Skin:  Negative for itching and rash.  Neurological:  Negative for dizziness, weakness and headaches.  Endo/Heme/Allergies:  Does not bruise/bleed easily.  Psychiatric/Behavioral:  Negative for depression, substance abuse and suicidal ideas.    The following portions of the patient's history were reviewed and updated as appropriate: allergies, current medications, past family history, past medical history, past social history, past surgical history and problem list. Problem list updated.   See flowsheet for other program required questions.  Objective:   Vitals:   12/09/20 0851  BP: 123/75  Weight: 251 lb 9.6 oz (114.1 kg)  Height: 5\' 6"  (1.676 m)    Physical Exam Vitals and nursing note reviewed.  Constitutional:      Appearance: Normal appearance.  HENT:     Head: Normocephalic and atraumatic.     Mouth/Throat:     Mouth: Mucous membranes are moist.     Dentition: Normal dentition. No dental caries.     Pharynx: No oropharyngeal exudate or posterior oropharyngeal erythema.  Eyes:     General: No scleral icterus.    Comments: Bilateral eyes slightly enlarged d/t Graves disease   Neck:     Thyroid: Thyromegaly present. No thyroid mass  or thyroid tenderness.     Comments: Pt has graves disease Cardiovascular:     Rate and Rhythm: Normal rate and regular rhythm.     Pulses: Normal pulses.     Heart sounds: Normal heart sounds.  Pulmonary:     Effort: Pulmonary effort is normal.     Breath sounds: Normal breath sounds.  Chest:  Breasts:    Right: Normal.     Left: Normal.     Comments: Breasts:        Right: Normal. No swelling, mass, nipple discharge, skin change or tenderness.        Left: Normal. No swelling, mass, nipple discharge, skin change or tenderness.    Abdominal:     General: Abdomen is flat. Bowel sounds are normal.     Palpations: Abdomen is soft.  Genitourinary:    Comments: Deferred, Pt self colleted samples  Musculoskeletal:        General: Normal range of motion.     Cervical back: Normal range of motion and neck supple.  Skin:    General: Skin is warm and dry.  Neurological:     General: No focal deficit present.     Mental Status: She is alert and oriented to person, place, and time.  Psychiatric:        Behavior: Behavior normal.      Assessment and Plan:  Vickie Greene is a 45 y.o. female presenting to the [facility] Department for an initial annual wellness/contraceptive visit .   1. Routine general medical examination at a health care facility Well woman exam CBE  Pap due 06/2023 Anticipated guidance - 47 colonoscopy   2. Screening examination for venereal disease Wet prep negative, state lab results pending.   Pt declined blood work today.   - Chlamydia/Gonorrhea [facility] - WET PREP FOR TRICH, YEAST, CLUE  3. Family planning counseling  Contraception counseling: Reviewed all forms of birth control options in the tiered based approach. available including abstinence; over the counter/barrier methods; hormonal contraceptive medication including pill, patch, ring, injection,contraceptive implant, ECP; hormonal and nonhormonal IUDs; permanent sterilization options including vasectomy and the various tubal sterilization modalities. Risks, benefits, and typical effectiveness rates were reviewed.  Questions were answered.  Written information was also given to the patient to review.  Patient desires no BCM, this was prescribed for patient. She will follow up as needed for surveillance.  She was told to call with any further questions, or with any concerns about this method of contraception.  Emphasized use of condoms 100% of the time for STI prevention.  Patient was not offered ECP based on  patient preference   Return in about 1 year (around 12/09/2021) for annual well woman exam.  No future appointments.  Wendi Snipes, FNP

## 2021-06-03 DIAGNOSIS — E0590 Thyrotoxicosis, unspecified without thyrotoxic crisis or storm: Secondary | ICD-10-CM | POA: Diagnosis not present

## 2021-06-03 DIAGNOSIS — Z1231 Encounter for screening mammogram for malignant neoplasm of breast: Secondary | ICD-10-CM | POA: Diagnosis not present

## 2021-06-03 DIAGNOSIS — Z0000 Encounter for general adult medical examination without abnormal findings: Secondary | ICD-10-CM | POA: Diagnosis not present

## 2021-06-03 DIAGNOSIS — Z1331 Encounter for screening for depression: Secondary | ICD-10-CM | POA: Diagnosis not present

## 2022-07-01 DIAGNOSIS — Z6839 Body mass index (BMI) 39.0-39.9, adult: Secondary | ICD-10-CM | POA: Diagnosis not present

## 2022-07-01 DIAGNOSIS — Z880 Allergy status to penicillin: Secondary | ICD-10-CM | POA: Diagnosis not present

## 2022-07-01 DIAGNOSIS — E0500 Thyrotoxicosis with diffuse goiter without thyrotoxic crisis or storm: Secondary | ICD-10-CM | POA: Diagnosis not present

## 2022-07-01 DIAGNOSIS — Z87891 Personal history of nicotine dependence: Secondary | ICD-10-CM | POA: Diagnosis not present

## 2022-07-01 DIAGNOSIS — E669 Obesity, unspecified: Secondary | ICD-10-CM | POA: Diagnosis not present

## 2023-02-04 DIAGNOSIS — E0590 Thyrotoxicosis, unspecified without thyrotoxic crisis or storm: Secondary | ICD-10-CM | POA: Diagnosis not present

## 2023-02-04 DIAGNOSIS — E66812 Obesity, class 2: Secondary | ICD-10-CM | POA: Diagnosis not present

## 2023-02-04 DIAGNOSIS — Z1212 Encounter for screening for malignant neoplasm of rectum: Secondary | ICD-10-CM | POA: Diagnosis not present

## 2023-02-04 DIAGNOSIS — Z0000 Encounter for general adult medical examination without abnormal findings: Secondary | ICD-10-CM | POA: Diagnosis not present

## 2023-02-04 DIAGNOSIS — Z6835 Body mass index (BMI) 35.0-35.9, adult: Secondary | ICD-10-CM | POA: Diagnosis not present

## 2023-02-04 DIAGNOSIS — E6609 Other obesity due to excess calories: Secondary | ICD-10-CM | POA: Diagnosis not present

## 2023-02-04 DIAGNOSIS — M79642 Pain in left hand: Secondary | ICD-10-CM | POA: Diagnosis not present

## 2023-02-04 DIAGNOSIS — Z1211 Encounter for screening for malignant neoplasm of colon: Secondary | ICD-10-CM | POA: Diagnosis not present

## 2023-02-04 DIAGNOSIS — M79641 Pain in right hand: Secondary | ICD-10-CM | POA: Diagnosis not present

## 2023-02-04 DIAGNOSIS — Z1231 Encounter for screening mammogram for malignant neoplasm of breast: Secondary | ICD-10-CM | POA: Diagnosis not present

## 2023-02-07 ENCOUNTER — Other Ambulatory Visit: Payer: Self-pay | Admitting: Infectious Diseases

## 2023-02-07 DIAGNOSIS — Z1231 Encounter for screening mammogram for malignant neoplasm of breast: Secondary | ICD-10-CM
